# Patient Record
Sex: Female | Born: 1970 | Hispanic: No | Smoking: Never smoker
Health system: Southern US, Community
[De-identification: ages and names within clinical notes are randomized; demographics above are authoritative.]

## PROBLEM LIST (undated history)

## (undated) DIAGNOSIS — M549 Dorsalgia, unspecified: Secondary | ICD-10-CM

## (undated) DIAGNOSIS — G8929 Other chronic pain: Secondary | ICD-10-CM

## (undated) DIAGNOSIS — G43909 Migraine, unspecified, not intractable, without status migrainosus: Secondary | ICD-10-CM

## (undated) DIAGNOSIS — M5416 Radiculopathy, lumbar region: Secondary | ICD-10-CM

---

## 1997-10-08 ENCOUNTER — Ambulatory Visit (HOSPITAL_COMMUNITY): Admission: RE | Admit: 1997-10-08 | Discharge: 1997-10-08 | Payer: Self-pay | Admitting: Obstetrics

## 1997-12-08 ENCOUNTER — Inpatient Hospital Stay (HOSPITAL_COMMUNITY): Admission: AD | Admit: 1997-12-08 | Discharge: 1997-12-10 | Payer: Self-pay | Admitting: *Deleted

## 2002-06-17 ENCOUNTER — Emergency Department (HOSPITAL_COMMUNITY): Admission: EM | Admit: 2002-06-17 | Discharge: 2002-06-17 | Payer: Self-pay | Admitting: Emergency Medicine

## 2002-07-05 ENCOUNTER — Ambulatory Visit (HOSPITAL_COMMUNITY): Admission: RE | Admit: 2002-07-05 | Discharge: 2002-07-05 | Payer: Self-pay | Admitting: Internal Medicine

## 2002-09-02 ENCOUNTER — Encounter: Payer: Self-pay | Admitting: Family Medicine

## 2002-09-02 ENCOUNTER — Ambulatory Visit (HOSPITAL_COMMUNITY): Admission: RE | Admit: 2002-09-02 | Discharge: 2002-09-02 | Payer: Self-pay | Admitting: Family Medicine

## 2003-03-19 ENCOUNTER — Ambulatory Visit (HOSPITAL_COMMUNITY): Admission: RE | Admit: 2003-03-19 | Discharge: 2003-03-19 | Payer: Self-pay | Admitting: Internal Medicine

## 2005-11-30 ENCOUNTER — Ambulatory Visit: Payer: Self-pay | Admitting: Nurse Practitioner

## 2005-12-08 ENCOUNTER — Ambulatory Visit: Payer: Self-pay | Admitting: *Deleted

## 2005-12-19 ENCOUNTER — Encounter (INDEPENDENT_AMBULATORY_CARE_PROVIDER_SITE_OTHER): Payer: Self-pay | Admitting: *Deleted

## 2005-12-19 ENCOUNTER — Ambulatory Visit: Payer: Self-pay | Admitting: Nurse Practitioner

## 2006-09-20 ENCOUNTER — Encounter (INDEPENDENT_AMBULATORY_CARE_PROVIDER_SITE_OTHER): Payer: Self-pay | Admitting: *Deleted

## 2006-11-06 ENCOUNTER — Encounter: Admission: RE | Admit: 2006-11-06 | Discharge: 2006-11-06 | Payer: Self-pay | Admitting: Orthopedic Surgery

## 2008-03-10 IMAGING — CT CT L SPINE W/ CM
4 of 10 series · 11 of 33 positions shown, 13 images · IV contrast (omnipaque)
Comparison: none

CLINICAL DATA: Low back pain extends into the left leg.  Patient is referred for a lumbar myelogram.  
 LUMBAR MYELOGRAM:
TECHNIQUE: Lumbar puncture was performed centrally at the L-4 level.  CSF is clear.  15 cc Omnipaque 180 contrast injected.  The needle was withdrawn and multiple images are obtained which are supplemented with standing flexion and extension views.
TECHNIQUE: Multidetector CT imaging of the lumbar spine was performed after intrathecal injection of contrast.  Helical imaging supplemented with coronal and sagittal reformat images.

[Series 2: l spine · axial · 0.27mm/px · z∈[-175,-100]mm · 2 of 91 slices shown]
[im 31/91  bone]
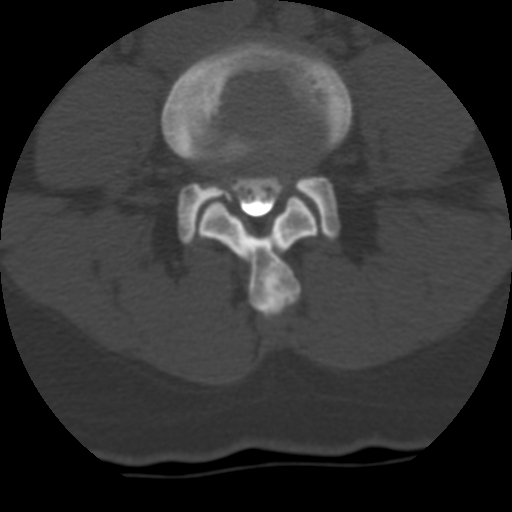
[im 61/91  bone]
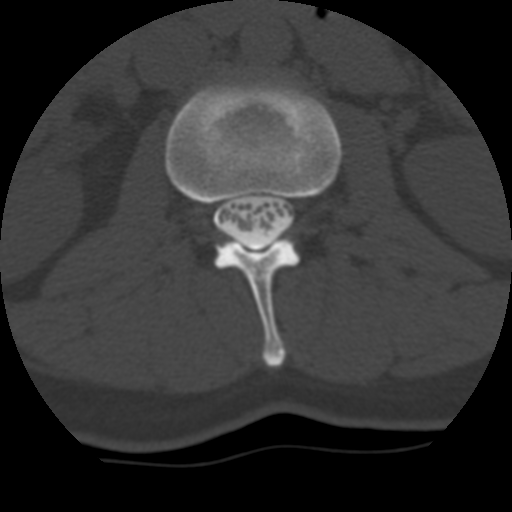

[Series 3: bone windows · axial · 0.27mm/px · z∈[-195,-83]mm · 3 of 91 slices shown, 4 images]
[im 23/91  soft-tissue]
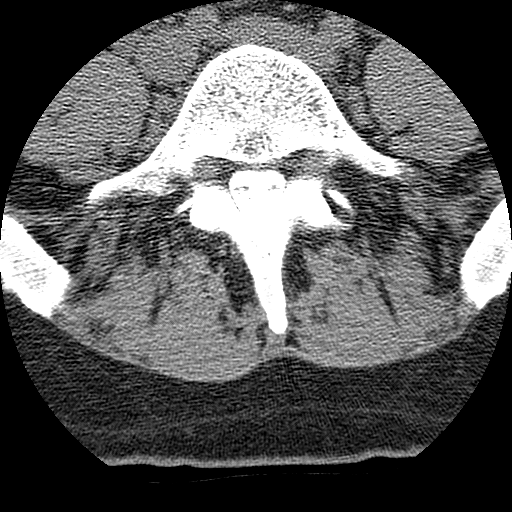
[im 23/91  bone]
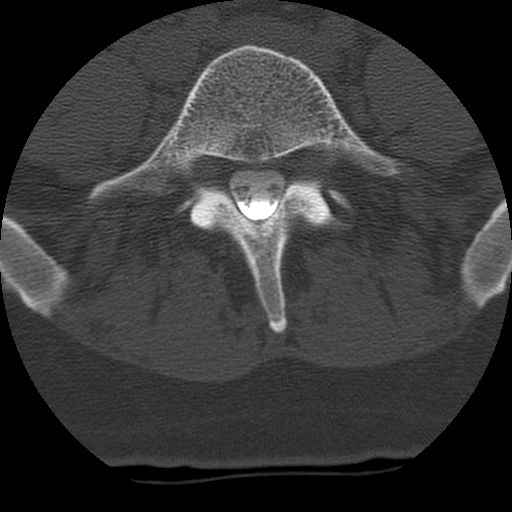
[im 46/91  bone]
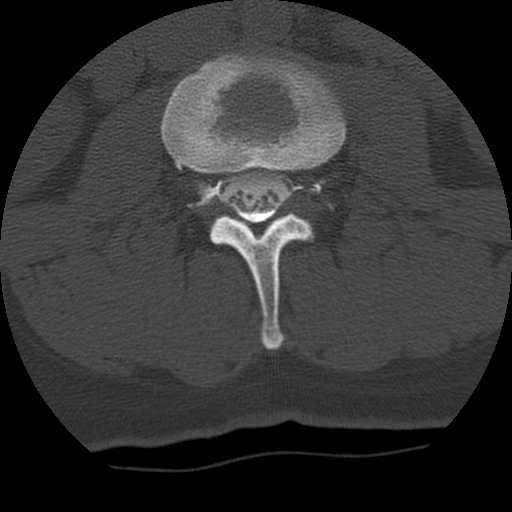
[im 68/91  bone]
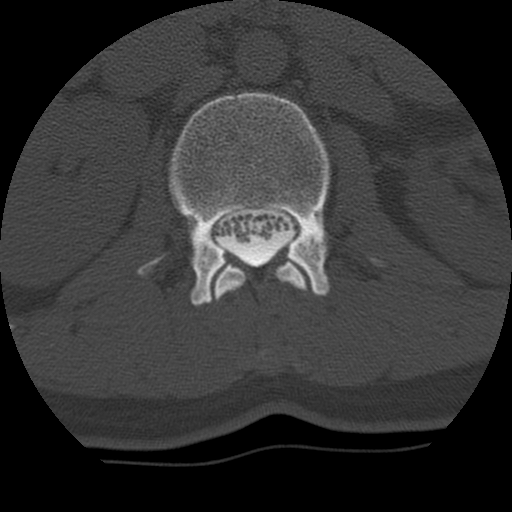

[Series 400: cor upper l-spne · coronal · 0.44mm/px · 1 of 40 slices shown]
[im 20/40  bone]
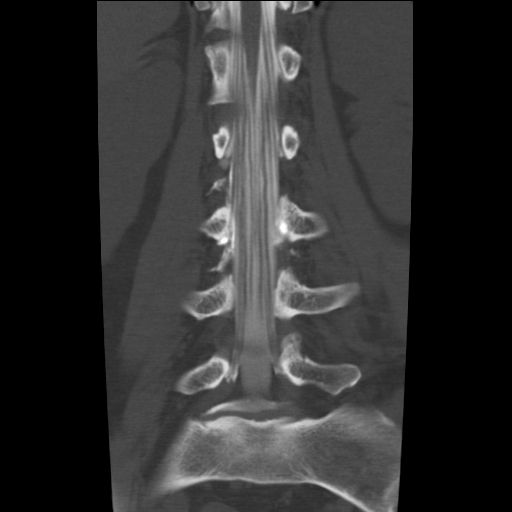

[Series 402: sag l-spne · sagittal · 0.44mm/px · 5 of 40 slices shown, 6 images]
[im 14/40  bone]
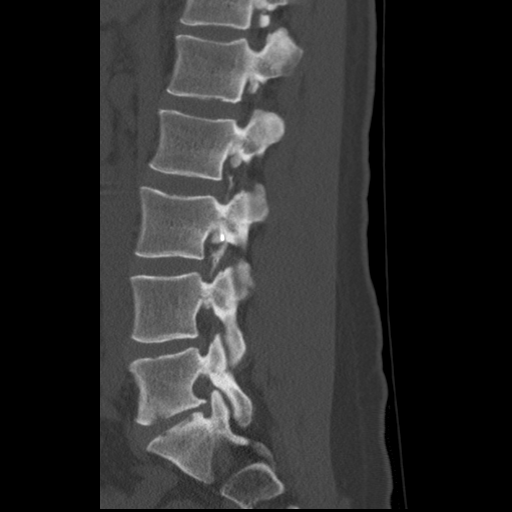
[im 17/40  bone]
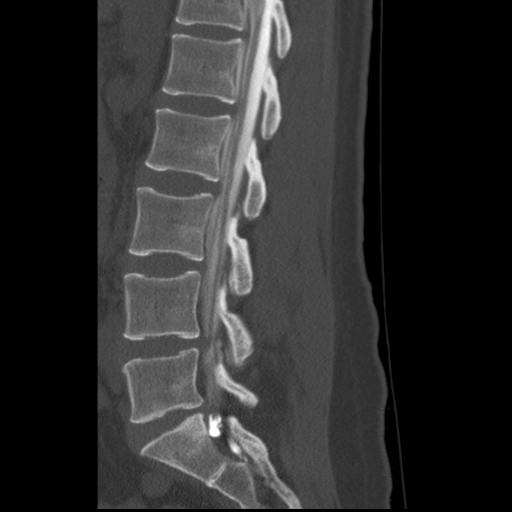
[im 20/40  soft-tissue]
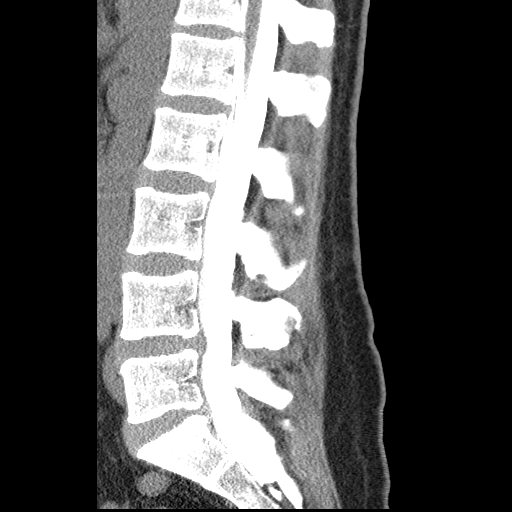
[im 20/40  bone]
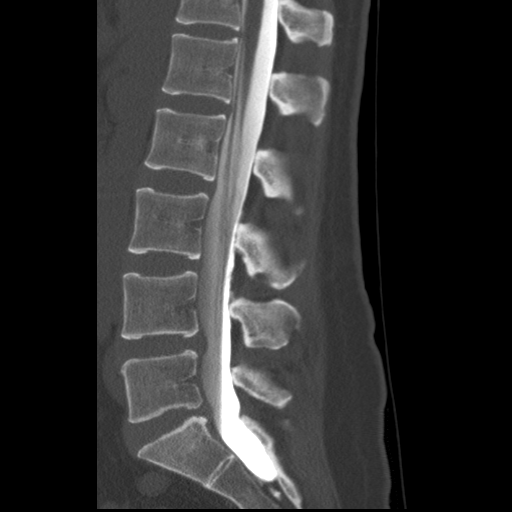
[im 23/40  bone]
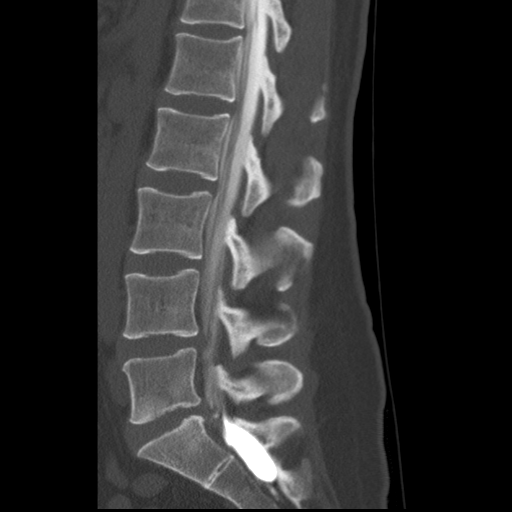
[im 27/40  bone]
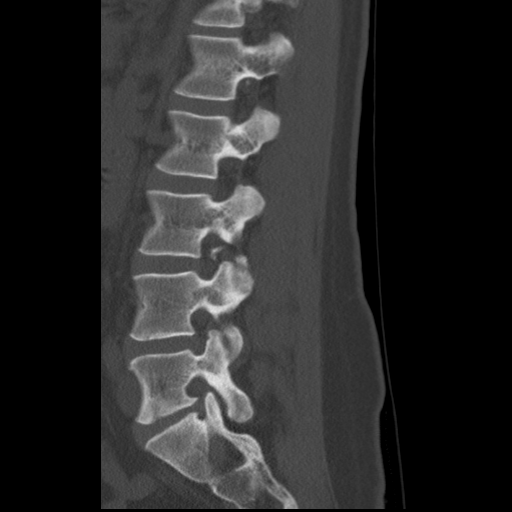

[11 of 33 positions shown; findings below may reference images not displayed]

FINDINGS: Ventral defect at L4-5 with moderate central stenosis.  No appreciable listhesis through flexion and extension.  There is disc height loss at L5-S1.  No extradural defects.
IMPRESSION: Degenerative disc disease L4-5 and L5-S1.  CT to follow.
 CT LUMBAR SPINE WITH CONTRAST (POST-MYELOGRAM):
FINDINGS: Reformat images show degenerative disc disease L5-S1.  Disc protrusion.  Foraminal narrowing bilaterally is multifactorial without encroachment on the exiting nerve roots.  At L4-5 a broad central protrusion is noted.  Conus terminates at L-1 which is unremarkable. 
 Axial Imaging:  
 L1-2:  Normal.  
 L2-3:  Normal. 
 L3-4:  Mild facet degenerative change.  No central nor neural foraminal stenosis. 
 L4-5:  Broad central protrusion with slight caudal extension.  Facet degenerative change bilaterally.  The L-4 roots exit without encroachment.  No significant lateral recess stenosis.  Mild multifactorial central stenosis. 
 L5-S1:  Disc bulge with caudal extension.  Facet degenerative change with uncinate spurring.  There is multifactorial narrowing of the left L-5 foramen without definite encroachment on the L-5 root.  There is underfilling of the left S-1 root without extruded disc material appreciated.
IMPRESSION: 1.  Degenerative disc disease with multifactorial left foraminal narrowing.  No definite encroachment on the L-5 root.  
 2.  L4-5:  Broad disc protrusion with facet degenerative change resulting in mild multifactorial central stenosis.

## 2008-03-31 ENCOUNTER — Ambulatory Visit: Payer: Self-pay | Admitting: Internal Medicine

## 2008-03-31 ENCOUNTER — Encounter (INDEPENDENT_AMBULATORY_CARE_PROVIDER_SITE_OTHER): Payer: Self-pay | Admitting: Internal Medicine

## 2008-03-31 LAB — CONVERTED CEMR LAB
Albumin: 4.1 g/dL (ref 3.5–5.2)
Alkaline Phosphatase: 53 units/L (ref 39–117)
BUN: 19 mg/dL (ref 6–23)
Calcium: 9 mg/dL (ref 8.4–10.5)
Chloride: 103 meq/L (ref 96–112)
Eosinophils Relative: 3 % (ref 0–5)
Glucose, Bld: 89 mg/dL (ref 70–99)
HCT: 37.9 % (ref 36.0–46.0)
Hemoglobin: 12.4 g/dL (ref 12.0–15.0)
Lymphocytes Relative: 24 % (ref 12–46)
Lymphs Abs: 1.8 10*3/uL (ref 0.7–4.0)
Monocytes Absolute: 0.7 10*3/uL (ref 0.1–1.0)
Monocytes Relative: 10 % (ref 3–12)
Neutro Abs: 4.7 10*3/uL (ref 1.7–7.7)
Potassium: 4.4 meq/L (ref 3.5–5.3)
RBC: 4.08 M/uL (ref 3.87–5.11)
Sodium: 138 meq/L (ref 135–145)
Total Protein: 7 g/dL (ref 6.0–8.3)
WBC: 7.4 10*3/uL (ref 4.0–10.5)

## 2013-12-10 ENCOUNTER — Encounter (HOSPITAL_COMMUNITY): Payer: Self-pay | Admitting: *Deleted

## 2013-12-11 ENCOUNTER — Encounter (HOSPITAL_COMMUNITY): Payer: Self-pay

## 2013-12-11 ENCOUNTER — Ambulatory Visit (HOSPITAL_COMMUNITY)
Admission: RE | Admit: 2013-12-11 | Discharge: 2013-12-11 | Disposition: A | Discharge status: Home / Self Care | Payer: Self-pay | Source: Ambulatory Visit | Attending: Obstetrics and Gynecology | Admitting: Obstetrics and Gynecology

## 2013-12-11 ENCOUNTER — Other Ambulatory Visit: Payer: Self-pay | Admitting: Obstetrics and Gynecology

## 2013-12-11 VITALS — BP 116/64 | Temp 98.7°F | Wt 144.0 lb

## 2013-12-11 DIAGNOSIS — R87810 Cervical high risk human papillomavirus (HPV) DNA test positive: Secondary | ICD-10-CM

## 2013-12-11 DIAGNOSIS — R87610 Atypical squamous cells of undetermined significance on cytologic smear of cervix (ASC-US): Secondary | ICD-10-CM

## 2013-12-11 DIAGNOSIS — Z1231 Encounter for screening mammogram for malignant neoplasm of breast: Secondary | ICD-10-CM

## 2013-12-11 DIAGNOSIS — Z1239 Encounter for other screening for malignant neoplasm of breast: Secondary | ICD-10-CM

## 2013-12-11 NOTE — Patient Instructions (Signed)
Explained to Marie Fleming the follow-up needed for her abnormal Pap smear. [facility] per recommendation to follow-up for abnormal Pap smear. Appointment scheduled for Monday, January 13, 2014 at 1400. Lowen Shon HaleLeon verbalized understanding. Patient escorted to mammography for a screening mammogram.  Roxana Lai, [name], RN 4:24 PM

## 2013-12-11 NOTE — Progress Notes (Signed)
[facility] smear at the Free cervical cancer screening on 11/04/2013.  Pap Smear: Pap smear not completed today. Last Pap smear was 11/04/2013 at the free Pap smear screening at [facility] and ASCUS HPV+.  [facility] for a colposcopy per recommendation to follow-up for abnormal Pap smear. Appointment scheduled for Monday, January 13, 2014 at 1400. Per patient her most recent Pap smear is the only abnormal Pap smear she has had. Last Pap smear is scanned into EPIC under media.  Physical exam: Breasts Breasts symmetrical. No skin abnormalities bilateral breasts. No nipple retraction bilateral breasts. No nipple discharge bilateral breasts. No lymphadenopathy. No lumps palpated bilateral breasts. No complaints of pain or tenderness on exam. Patient escorted to mammography for a screening mammogram.        Pelvic/Bimanual No Pap smear completed today since last Pap smear was 11/04/2013. Pap smear not indicated per BCCCP guidelines.

## 2014-01-13 ENCOUNTER — Other Ambulatory Visit (HOSPITAL_COMMUNITY)
Admission: RE | Admit: 2014-01-13 | Discharge: 2014-01-13 | Disposition: A | Discharge status: Home / Self Care | Payer: Self-pay | Source: Ambulatory Visit | Attending: Nurse Practitioner | Admitting: Nurse Practitioner

## 2014-01-13 ENCOUNTER — Ambulatory Visit (INDEPENDENT_AMBULATORY_CARE_PROVIDER_SITE_OTHER): Payer: Self-pay | Admitting: Nurse Practitioner

## 2014-01-13 ENCOUNTER — Encounter: Payer: Self-pay | Admitting: Nurse Practitioner

## 2014-01-13 VITALS — BP 107/62 | HR 66 | Temp 98.8°F | Ht 64.0 in | Wt 148.5 lb

## 2014-01-13 DIAGNOSIS — N942 Vaginismus: Secondary | ICD-10-CM

## 2014-01-13 DIAGNOSIS — R87610 Atypical squamous cells of undetermined significance on cytologic smear of cervix (ASC-US): Secondary | ICD-10-CM

## 2014-01-13 DIAGNOSIS — R87619 Unspecified abnormal cytological findings in specimens from cervix uteri: Secondary | ICD-10-CM | POA: Insufficient documentation

## 2014-01-13 LAB — POCT PREGNANCY, URINE: PREG TEST UR: NEGATIVE

## 2014-01-13 NOTE — Progress Notes (Signed)
    GYNECOLOGY CLINIC COLPOSCOPY PROCEDURE NOTE Marie GrillsDivina Fleming  44 y.o. G1P1001 here for colposcopy for ASCUS with POSITIVE high risk HPV pap smear on 11/04/13. Discussed role for HPV in cervical dysplasia, need for surveillance.  Patient given informed consent, signed copy in the chart, time out was performed.  Placed in lithotomy position. Cervix viewed with speculum and colposcope after application of acetic acid.   Colposcopy adequate? Yes  visible lesion(s) at 5, 10, 12  o'clock; biopsies obtained.  ECC specimen obtained. All specimens were labelled and sent to pathology.  Patient was given post procedure instructions.  Will follow up pathology and manage accordingly.  Routine preventative health maintenance measures emphasized.  Results for orders placed or performed in visit on 01/13/14 (from the past 24 hour(s))  Pregnancy, urine POC     Status: None   Collection Time: 01/13/14  2:02 PM  Result Value Ref Range   Preg Test, Ur NEGATIVE NEGATIVE     Jahshua Bonito, NP Faculty Practice, [facility]

## 2014-01-13 NOTE — Addendum Note (Signed)
Addended by: Louanna RawAMPBELL, Britlee Skolnik M on: 01/13/2014 04:35 PM   Modules accepted: Orders

## 2014-01-13 NOTE — Progress Notes (Signed)
Marie Fleming. [name] present as Research officer, trade unionpanish Interpreter for encounter

## 2014-01-13 NOTE — Patient Instructions (Signed)
Colposcopa, Cuidado posterior (Colposcopy, Care After) La colposcopa es un procedimiento en el que se utiliza una herramienta especial para magnificar la superficie del cuello del tero. Tambin es posible que se tome una muestra de tejido (biopsia). Esta muestra se observar para identificar la presencia de cncer cervical u otros problemas. Despus del procedimiento:  Podr sentir algunos clicos.  Recustese algunos minutos si se siente mareada.  Podr tener un sangrado que debera detenerse luego de algunos das. CUIDADOS EN EL HOGAR  No tenga relaciones sexuales ni use tampones durante 2 o 3 das o segn le hayan indicado.  Slo tome medicamentos como lo indique su mdico.  Contine tomando las pastillas anticonceptivas de la forma habitual. Averige los resultados de su anlisis Pregunte cundo estarn listos los resultados del examen. Asegrese de obtener los resultados. SOLICITE AYUDA DE INMEDIATO SI:  Tiene un sangrado abundante o elimina cogulos.  Su temperatura es de 102 F (38.9 C) o mayor.  Observa una secrecin vaginal anormal.  Tiene clicos que no se van con los medicamentos.  Siente mareos, vrtigo o pierde el conocimiento (se desmaya). ASEGRESE DE QUE:   Comprende estas instrucciones.  Controlar su enfermedad.  Solicitar ayuda de inmediato si no mejora o si empeora. Document Released: 01/22/2010 Document Revised: 03/14/2011 ExitCare Patient Information 2015 ExitCare, LLC. This information is not intended to replace advice given to you by your health care provider. Make sure you discuss any questions you have with your health care provider.  

## 2014-01-16 ENCOUNTER — Telehealth: Payer: Self-pay

## 2014-01-16 NOTE — Telephone Encounter (Signed)
Attempted to contact patient. No answer. Left message stating we are calling with results, please call clinic.  

## 2014-01-16 NOTE — Telephone Encounter (Signed)
-----   Message from Delbert PhenixLinda M Barefoot, NP sent at 01/15/2014  5:28 PM EST ----- Please repeat pap in one year, thank you, Bonita QuinLinda

## 2014-01-17 ENCOUNTER — Encounter: Payer: Self-pay | Admitting: *Deleted

## 2014-01-21 NOTE — Telephone Encounter (Signed)
Called patient with pacific interpreter [phone]#[id number]. Informed her of results and recommendations. Patient verbalized understanding and gratitude. No questions or concerns.

## 2014-02-01 ENCOUNTER — Encounter (HOSPITAL_COMMUNITY): Payer: Self-pay | Admitting: Emergency Medicine

## 2014-02-01 ENCOUNTER — Emergency Department (HOSPITAL_COMMUNITY)
Admission: EM | Admit: 2014-02-01 | Discharge: 2014-02-01 | Disposition: A | Discharge status: Home / Self Care | Payer: Self-pay | Attending: Emergency Medicine | Admitting: Emergency Medicine

## 2014-02-01 DIAGNOSIS — G43109 Migraine with aura, not intractable, without status migrainosus: Secondary | ICD-10-CM | POA: Insufficient documentation

## 2014-02-01 DIAGNOSIS — G8929 Other chronic pain: Secondary | ICD-10-CM | POA: Insufficient documentation

## 2014-02-01 HISTORY — DX: Dorsalgia, unspecified: M54.9

## 2014-02-01 HISTORY — DX: Other chronic pain: G89.29

## 2014-02-01 HISTORY — DX: Radiculopathy, lumbar region: M54.16

## 2014-02-01 MED ORDER — PROCHLORPERAZINE EDISYLATE 5 MG/ML IJ SOLN
10.0000 mg | Freq: Four times a day (QID) | INTRAMUSCULAR | Status: DC | PRN
  Administered 2014-02-01: 10 mg via INTRAVENOUS
  Filled 2014-02-01: qty 2

## 2014-02-01 MED ORDER — KETOROLAC TROMETHAMINE 30 MG/ML IJ SOLN
30.0000 mg | Freq: Once | INTRAMUSCULAR | Status: AC
  Administered 2014-02-01: 30 mg via INTRAVENOUS
  Filled 2014-02-01: qty 1

## 2014-02-01 MED ORDER — DIPHENHYDRAMINE HCL 50 MG/ML IJ SOLN
12.5000 mg | Freq: Once | INTRAMUSCULAR | Status: AC
  Administered 2014-02-01: 12.5 mg via INTRAVENOUS
  Filled 2014-02-01: qty 1

## 2014-02-01 MED ORDER — BUTALBITAL-APAP-CAFFEINE 50-325-40 MG PO TABS: 1.0000 | ORAL_TABLET | Freq: Four times a day (QID) | ORAL | Status: AC | PRN

## 2014-02-01 NOTE — ED Notes (Signed)
Sister-in-law/translator stated, she has a headache and nausea since 1130

## 2014-02-01 NOTE — ED Provider Notes (Signed)
CSN: [id number]     Arrival date & time 02/01/14  1451 History   First MD Initiated Contact with Patient 02/01/14 1725     Chief Complaint  Patient presents with  . Headache  . Nausea    Patient is a 44 y.o. female presenting with headaches. The history is provided by the patient.  Headache Pain location:  Frontal Quality:  Dull Radiates to:  Does not radiate Onset quality:  Gradual Duration:  7 hours (this morning) Timing:  Constant Chronicity:  Recurrent Relieved by:  Nothing Associated symptoms: blurred vision, nausea and vomiting   Associated symptoms: no fever, no numbness, no paresthesias, no sinus pressure and no weakness     Past Medical History  Diagnosis Date  . Chronic back pain   . Lumbar radiculopathy    History reviewed. No pertinent past surgical history. No family history on file. History  Substance Use Topics  . Smoking status: Never Smoker   . Smokeless tobacco: Never Used  . Alcohol Use: No   OB History    Gravida Para Term Preterm AB TAB SAB Ectopic Multiple Living   1 1 1       1      Review of Systems  Constitutional: Negative for fever.  HENT: Negative for sinus pressure.   Eyes: Positive for blurred vision.  Gastrointestinal: Positive for nausea and vomiting.  Neurological: Positive for headaches. Negative for numbness and paresthesias.  All other systems reviewed and are negative.     Allergies  Review of patient's allergies indicates no known allergies.  Home Medications   Prior to Admission medications   Medication Sig Start Date End Date Taking? Authorizing Provider  ibuprofen (ADVIL,MOTRIN) 200 MG tablet Take 200 mg by mouth every 6 (six) hours as needed.    Historical Provider, MD   BP 98/59 mmHg  Pulse 71  Temp(Src) 97.5 F (36.4 C) (Oral)  Resp 17  Ht 5\' 5"  (1.651 m)  Wt 148 lb (67.132 kg)  BMI 24.63 kg/m2  SpO2 100%  LMP 01/11/2014 Physical Exam  Constitutional: She is oriented to person, place, and time. She  appears well-developed and well-nourished. No distress.  HENT:  Head: Normocephalic and atraumatic.  Right Ear: External ear normal.  Left Ear: External ear normal.  Mouth/Throat: Oropharynx is clear and moist.  Eyes: Conjunctivae are normal. Right eye exhibits no discharge. Left eye exhibits no discharge. No scleral icterus.  Neck: Normal range of motion. Neck supple. No muscular tenderness present. No rigidity. No tracheal deviation and normal range of motion present.  Cardiovascular: Normal rate, regular rhythm and intact distal pulses.   Pulmonary/Chest: Effort normal and breath sounds normal. No stridor. No respiratory distress. She has no wheezes. She has no rales.  Abdominal: Soft. Bowel sounds are normal. She exhibits no distension. There is no tenderness. There is no rebound and no guarding.  Musculoskeletal: She exhibits no edema or tenderness.  Neurological: She is alert and oriented to person, place, and time. She has normal strength. No cranial nerve deficit (No facial droop, extraocular movements intact, tongue midline ) or sensory deficit. She exhibits normal muscle tone. She displays no seizure activity. Coordination normal.  No pronator drift bilateral upper extrem, able to hold both legs off bed for 5 seconds, sensation intact in all extremities, no visual field cuts, no left or right sided neglect, normal finger-nose exam bilaterally, no nystagmus noted   Skin: Skin is warm and dry. No rash noted.  Psychiatric: She has a normal  mood and affect.  Nursing note and vitals reviewed.   ED Course  Procedures (including critical care time) Medications  prochlorperazine (COMPAZINE) injection 10 mg (10 mg Intravenous Given 02/01/14 1805)  diphenhydrAMINE (BENADRYL) injection 12.5 mg (12.5 mg Intravenous Given 02/01/14 1805)  ketorolac (TORADOL) 30 MG/ML injection 30 mg (30 mg Intravenous Given 02/01/14 1805)     MDM   Final diagnoses:  Migraine with aura and without status  migrainosus, not intractable    The patient has a history of having recurrent headache episodes. Her symptoms are suggestive of a migraine headache. She was treated in the emergency room and had complete relief of her headache. She has a normal neurologic exam. I doubt meningitis, mass, intracerebral hemorrhage.  At this time there does not appear to be any evidence of an acute emergency medical condition and the patient appears stable for discharge with appropriate outpatient follow up.     Linwood Dibbles, MD 02/01/14 [phone]

## 2014-02-01 NOTE — Discharge Instructions (Signed)
Cefalea migrañosa °(Migraine Headache) °Una cefalea migrañosa es un dolor intenso y punzante en uno o ambos lados de la cabeza. La migraña puede durar desde 30 minutos hasta varias horas. °CAUSAS  °No siempre se conoce la causa exacta de la cefalea migrañosa. Sin embargo, pueden aparecer cuando los nervios del cerebro se irritan y liberan ciertas sustancias químicas que causan inflamación. Esto ocasiona dolor. °Existen también ciertos factores que pueden desencadenar las migrañas, como los siguientes: °· Alcohol. °· Fumar. °· Estrés. °· La menstruación °· Quesos añejados. °· Los alimentos o las bebidas que contienen nitratos, glutamato, aspartamo o tiramina. °· Falta de sueño. °· Chocolate. °· Cafeína. °· Hambre. °· Actividad física extenuante. °· Fatiga. °· Medicamentos que se usan para tratar el dolor en el pecho (nitroglicerina), píldoras anticonceptivas, estrógeno y algunos medicamentos para la hipertensión arterial. °SIGNOS Y SÍNTOMAS °· Dolor en uno o ambos lados de la cabeza. °· Dolor pulsante o punzante. °· Dolor intenso que impide realizar las actividades diarias. °· Dolor que se agrava por cualquier actividad física. °· Náuseas, vómitos o ambos. °· Mareos. °· Dolor con la exposición a las luces brillantes, a los ruidos fuertes o la actividad. °· Sensibilidad general a las luces brillantes, a los ruidos fuertes o a los olores. °Antes de sufrir una migraña, puede recibir señales de advertencia (aura). Un aura puede incluir: °· Ver las luces intermitentes. °· Ver puntos brillantes, halos o líneas en zigzag. °· Tener una visión en túnel o visión borrosa. °· Sensación de entumecimiento u hormigueo. °· Dificultad para hablar °· Debilidad muscular. °DIAGNÓSTICO  °La cefalea migrañosa se diagnostica en función de lo siguiente: °· Síntomas. °· Examen físico. °· Una TC (tomografía computada) o resonancia magnética de la cabeza. Estas pruebas de diagnóstico por imagen no pueden diagnosticar las migrañas, pero pueden  ayudar a descartar otras causas de las cefaleas. °TRATAMIENTO °Le prescribirán medicamentos para aliviar el dolor y las náuseas. También podrán administrarse medicamentos para ayudar a prevenir las migrañas recurrentes.  °INSTRUCCIONES PARA EL CUIDADO EN EL HOGAR °· Sólo tome medicamentos de venta libre o recetados para calmar el dolor o el malestar, según las indicaciones de su médico. No se recomienda usar los opiáceos a largo plazo. °· Cuando tenga la migraña, acuéstese en un cuarto oscuro y tranquilo °· Lleve un registro diario para averiguar lo que puede provocar las cefaleas migrañosas. Por ejemplo, escriba: °¨ Lo que usted come y bebe. °¨ Cuánto tiempo duerme. °¨ Algún cambio en su dieta o en los medicamentos. °· Limite el consumo de bebidas alcohólicas. °· Si fuma, deje de hacerlo. °· Duerma entre 7 y 9 horas, o según las recomendaciones del médico. °· Limite el estrés. °· Mantenga las luces tenues si le molestan las luces brillantes y la migraña empeora. °SOLICITE ATENCIÓN MÉDICA DE INMEDIATO SI:  °· La migraña se hace cada vez más intensa. °· Tiene fiebre. °· Presenta rigidez en el cuello. °· Tiene pérdida de visión. °· Presenta debilidad muscular o pérdida del control muscular. °· Comienza a perder el equilibrio o tiene problemas para caminar. °· Sufre mareos o se desmaya. °· Tiene síntomas graves que son diferentes a los primeros síntomas. °ASEGÚRESE DE QUE:  °· Comprende estas instrucciones. °· Controlará su afección. °· Recibirá ayuda de inmediato si no mejora o si empeora. °Document Released: 12/20/2004 Document Revised: 10/10/2012 °ExitCare® Patient Information ©2015 ExitCare, LLC. This information is not intended to replace advice given to you by your health care provider. Make sure you discuss any questions you have with your   health care provider. ° ° °

## 2017-03-21 ENCOUNTER — Ambulatory Visit (HOSPITAL_COMMUNITY)
Admission: RE | Admit: 2017-03-21 | Discharge: 2017-03-21 | Disposition: A | Discharge status: Home / Self Care | Payer: Self-pay | Source: Ambulatory Visit | Attending: Obstetrics and Gynecology | Admitting: Obstetrics and Gynecology

## 2017-03-21 ENCOUNTER — Encounter (HOSPITAL_COMMUNITY): Payer: Self-pay

## 2017-03-21 VITALS — BP 108/70 | Ht 64.0 in | Wt 154.0 lb

## 2017-03-21 DIAGNOSIS — Z1239 Encounter for other screening for malignant neoplasm of breast: Secondary | ICD-10-CM

## 2017-03-21 DIAGNOSIS — R87613 High grade squamous intraepithelial lesion on cytologic smear of cervix (HGSIL): Secondary | ICD-10-CM

## 2017-03-21 HISTORY — DX: Migraine, unspecified, not intractable, without status migrainosus: G43.909

## 2017-03-21 NOTE — Progress Notes (Signed)
[facility] the Barry Brun[facility]ia Lonergan at [facility] due to having an abnormal Pap smear on 01/18/2017 that a colposcopy is recommended for follow-up.   Pap Smear: Pap smear not completed today. Last [facility]2017 [facility] and [facility]. [facility] a colposcopy to follow-up for her abnormal Pap smear. Appointment scheduled for Monday, April 24, 2017 at 1800. Per patient has a history of one other abnormal Pap smear 11/04/2013 that was ASCUS with positive HPV. Patient had a colposcopy to follow-up for her previous abnormal Pap smear on 01/13/2014 that was benign. Last two Pap smears and colposcopy results are in Epic.  Physical exam: Breasts Breasts symmetrical. No skin abnormalities bilateral breasts. No nipple retraction bilateral breasts. No nipple discharge bilateral breasts. No lymphadenopathy. No lumps palpated bilateral breasts. No complaints of pain or tenderness on exam. Patient had a screening mamm[facility] two months.        Pelvic/Bimanual No Pap smear completed today since last Pap smear was 01/18/2017. Pap smear not indicated per BCCCP guidelines.   Smoking History: Patient has never smoked.  Patient Navigation: Patient education provided. Access to services provided for [facility]. Spanish interpreter provided.   Breast and Cervical Cancer Risk Assessment: Patient has no family history of breast cancer, known genetic mutations, or radiation treatment to the chest before age 46. Patient has no history of cervical dysplasia, immunocompromised, or DES exposure in-utero.  Used Spanish interpreter [employer]rika McReynolds from [employer].

## 2017-03-21 NOTE — Patient Instructions (Signed)
Explained breast self awareness with [name]. Patient did not need a Pap smear today due to last Pap smear was 01/18/2017. Explained the colposcopy the recommended follow-up for her abnormal Pap smear. [facility] for a colposcopy to follow-up for her abnormal Pap smear. Appointment scheduled for Monday, April 24, 2017 at 1800. Patient aware of appointment and will be there. Marie Fleming verbalized understanding.  [name], [name], RN 4:23 PM

## 2017-03-27 ENCOUNTER — Encounter (HOSPITAL_COMMUNITY): Payer: Self-pay | Admitting: *Deleted

## 2017-04-24 ENCOUNTER — Telehealth: Payer: Self-pay | Admitting: General Practice

## 2017-04-24 ENCOUNTER — Ambulatory Visit: Payer: Self-pay | Admitting: Obstetrics and Gynecology

## 2017-04-24 NOTE — Telephone Encounter (Signed)
Called patient to reschedule appt for 05/22/17 at 10:35am.  Patient can only be seen on Monday's.  Patient voiced understanding.

## 2017-05-22 ENCOUNTER — Ambulatory Visit: Payer: Self-pay | Admitting: Obstetrics & Gynecology

## 2017-06-29 ENCOUNTER — Other Ambulatory Visit (HOSPITAL_COMMUNITY)
Admission: RE | Admit: 2017-06-29 | Discharge: 2017-06-29 | Disposition: A | Discharge status: Home / Self Care | Payer: Self-pay | Source: Ambulatory Visit | Attending: Obstetrics & Gynecology | Admitting: Obstetrics & Gynecology

## 2017-06-29 ENCOUNTER — Ambulatory Visit (INDEPENDENT_AMBULATORY_CARE_PROVIDER_SITE_OTHER): Payer: Self-pay | Admitting: Obstetrics & Gynecology

## 2017-06-29 ENCOUNTER — Encounter: Payer: Self-pay | Admitting: Obstetrics & Gynecology

## 2017-06-29 VITALS — BP 111/66 | HR 69 | Wt 155.1 lb

## 2017-06-29 DIAGNOSIS — R87613 High grade squamous intraepithelial lesion on cytologic smear of cervix (HGSIL): Secondary | ICD-10-CM | POA: Insufficient documentation

## 2017-06-29 LAB — POCT PREGNANCY, URINE: Preg Test, Ur: NEGATIVE

## 2017-06-29 NOTE — Progress Notes (Signed)
   Subjective:    Patient ID: Marie Fleming, female    DOB: [birth year], 47 y.o.   MRN: [id number]  HPI  47 yo single 25P1 38(46 yo son) Latina here for colpo due to [facility]. She reports that she had a colpo last year but no treatment.  Review of Systems She is abstinent.    Objective:   Physical Exam Breathing, conversing, and ambulating normally UPT negative, consent signed, time out done Cervix prepped with acetic acid. Transformation zone seen in its entirety. Colpo adequate. Aceto white changes on the anterior lip, a small (about 2mm area) of punctation at the 11 o'clock position I removed this area ECC obtained. She tolerated the procedure well.      Assessment & Plan:  HGSIL pap- await pathology Come back 4 weeks for results

## 2017-07-14 ENCOUNTER — Encounter: Payer: Self-pay | Admitting: *Deleted

## 2017-07-27 ENCOUNTER — Encounter: Payer: Self-pay | Admitting: Obstetrics & Gynecology

## 2017-07-27 ENCOUNTER — Ambulatory Visit (INDEPENDENT_AMBULATORY_CARE_PROVIDER_SITE_OTHER): Payer: Self-pay | Admitting: Obstetrics & Gynecology

## 2017-07-27 VITALS — BP 122/54 | HR 63 | Wt 156.4 lb

## 2017-07-27 DIAGNOSIS — D061 Carcinoma in situ of exocervix: Secondary | ICD-10-CM

## 2017-07-27 NOTE — Progress Notes (Signed)
Spanish Interpreter # Blanca Lindher 

## 2017-07-27 NOTE — Progress Notes (Signed)
   Subjective:    Patient ID: Marie Fleming, female    DOB: 1970-11-06, 47 y.o.   MRN: [id number]  HPI  47 yo single 45P1 34(46 yo son) here today for discussion of her pathology results. She had a [facility]CCCP pap that showed HGSIL. Her colpo was consistent with this, CIN3 was seen on her pathology with a negative ECC.   Review of Systems She has been abstinent since 2015.     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  She will watch the LEEP video today.

## 2017-09-01 ENCOUNTER — Ambulatory Visit: Payer: Self-pay | Admitting: Obstetrics & Gynecology

## 2017-09-06 ENCOUNTER — Telehealth: Payer: Self-pay | Admitting: Obstetrics & Gynecology

## 2017-09-06 NOTE — Telephone Encounter (Signed)
Called patient about her rescheduled appointment. Was not able to get through. I will be mailing her a reminder appointment letter.

## 2017-10-06 ENCOUNTER — Encounter: Payer: Self-pay | Admitting: Obstetrics & Gynecology

## 2017-10-06 ENCOUNTER — Other Ambulatory Visit (HOSPITAL_COMMUNITY)
Admission: RE | Admit: 2017-10-06 | Discharge: 2017-10-06 | Disposition: A | Discharge status: Home / Self Care | Payer: Self-pay | Source: Ambulatory Visit | Attending: Obstetrics & Gynecology | Admitting: Obstetrics & Gynecology

## 2017-10-06 ENCOUNTER — Ambulatory Visit (INDEPENDENT_AMBULATORY_CARE_PROVIDER_SITE_OTHER): Payer: Self-pay | Admitting: Obstetrics & Gynecology

## 2017-10-06 VITALS — BP 107/67 | HR 70 | Wt 156.1 lb

## 2017-10-06 DIAGNOSIS — Z3202 Encounter for pregnancy test, result negative: Secondary | ICD-10-CM

## 2017-10-06 DIAGNOSIS — D061 Carcinoma in situ of exocervix: Secondary | ICD-10-CM

## 2017-10-06 LAB — POCT PREGNANCY, URINE: Preg Test, Ur: NEGATIVE

## 2017-10-06 NOTE — Addendum Note (Signed)
Addended by: Allie Bossier on: 10/06/2017 01:08 PM   Modules accepted: Orders

## 2017-10-06 NOTE — Progress Notes (Signed)
   Subjective:    Patient ID: Marie Fleming, female    DOB: Sep 07, 1970, 47 y.o.   MRN: [id number]  HPI   47 yo single P95 (6 yo son) here today for a LEEP. She had a BCCCP pap that showed HGSIL. Her colpo was consistent with this, CIN3 was seen on her pathology with a negative ECC.  She watched the video at her last visit.  Review of Systems She has been abstinent since 2015.    Objective:   Physical Exam Breathing, conversing, and ambulating normally Well nourished, well hydrated Latina, no apparent distress  Video interpretor used for this exam  Colpo Biopsy:   Risks, benefits, alternatives, and limitations of procedure explained to patient, including pain, bleeding, infection, failure to remove abnormal tissue and failure to cure dysplasia, need for repeat procedures, damage to pelvic organs, cervical incompetence.  Role of HPV,cervical dysplasia and need for close followup was empasized. Informed written consent was obtained. All questions were answered. Time out performed. Urine pregnancy test was negative.  Procedure: The patient was placed in lithotomy position and the bivalved coated speculum was placed in the patient's vagina. A grounding pad placed on the patient. Acetic acid was applied to the cervix and areas of decreased uptake were noted around the transformation zone.   Local anesthesia was administered via an intracervical block using 20cc of 2% Lidocaine with epinephrine. The suction was turned on and the large 1X radiusCone Biopsy Excisor on 50 Watts of cutting current was used to excise the entire transformation zone and any areas of visible dysplasia. I obtained an ECC.  Excellent hemostasis was achieved using roller ball coagulation set at 50 Watts coagulation current. The speculum was removed from the vagina. Specimens were sent to pathology.  The patient tolerated the procedure well. Post-operative instructions given to patient, including instruction to seek medical  attention for persistent bright red bleeding, fever, abdominal/pelvic pain, dysuria, nausea or vomiting. She was also told about the possibility of having copious yellow to black tinged discharge for weeks. She was counseled to avoid anything in the vagina (sex/douching/tampons) for 3 weeks. She has a 4 week post-operative check to assess wound healing, review results and discuss further management.      Assessment & Plan:  CIN 3- await pathology from LEEP Come back 4 weeks for post op visit

## 2018-01-15 ENCOUNTER — Ambulatory Visit (INDEPENDENT_AMBULATORY_CARE_PROVIDER_SITE_OTHER): Payer: Self-pay | Admitting: *Deleted

## 2018-01-15 DIAGNOSIS — R87613 High grade squamous intraepithelial lesion on cytologic smear of cervix (HGSIL): Secondary | ICD-10-CM

## 2018-01-15 NOTE — Progress Notes (Signed)
Marie Fleming came to window asking for results of LEEP from 10/2017.  I was asked to speak with her.  Per chart results released by MyChart by Dr.Dove. I informed her results per Dr.Dove's message and that she needs pap in one year. I advised her to call [facility] to get that renewed and get pap with them. She voices understanding. I used [employer] [phone]

## 2018-01-18 NOTE — Progress Notes (Signed)
I have reviewed this chart and agree with the RN/CMA assessment and management.    Betty Brooks C Harika Laidlaw, MD, FACOG Attending Physician, [facility]  

## 2018-08-08 ENCOUNTER — Other Ambulatory Visit (HOSPITAL_COMMUNITY): Payer: Self-pay | Admitting: *Deleted

## 2018-08-08 DIAGNOSIS — Z1231 Encounter for screening mammogram for malignant neoplasm of breast: Secondary | ICD-10-CM

## 2018-11-06 ENCOUNTER — Ambulatory Visit (HOSPITAL_COMMUNITY): Payer: Self-pay

## 2022-03-31 ENCOUNTER — Ambulatory Visit
Admission: RE | Admit: 2022-03-31 | Discharge: 2022-03-31 | Disposition: A | Discharge status: Home / Self Care | Payer: Self-pay | Source: Ambulatory Visit | Attending: Physician Assistant | Admitting: Physician Assistant

## 2022-03-31 VITALS — BP 136/83 | HR 67 | Temp 98.3°F | Resp 19

## 2022-03-31 DIAGNOSIS — N920 Excessive and frequent menstruation with regular cycle: Secondary | ICD-10-CM

## 2022-03-31 DIAGNOSIS — R5383 Other fatigue: Secondary | ICD-10-CM

## 2022-03-31 LAB — POCT URINALYSIS DIP (MANUAL ENTRY)
Bilirubin, UA: NEGATIVE
Glucose, UA: NEGATIVE mg/dL
Ketones, POC UA: NEGATIVE mg/dL
Leukocytes, UA: NEGATIVE
Nitrite, UA: NEGATIVE
Protein Ur, POC: NEGATIVE mg/dL
Spec Grav, UA: 1.015 (ref 1.010–1.025)
Urobilinogen, UA: 0.2 E.U./dL
pH, UA: 7 (ref 5.0–8.0)

## 2022-03-31 MED ORDER — PRENATAL COMPLETE 14-0.4 MG PO TABS: 1.0000 | ORAL_TABLET | Freq: Every day | ORAL | 2 refills | Status: AC

## 2022-03-31 NOTE — ED Provider Notes (Signed)
[facility]    CSN: [id number] Arrival date & time: 03/31/22  1216      History   Chief Complaint Chief Complaint  Patient presents with   Vaginal Bleeding    I have been having excessive vaginal bleeding for over a week now and this has happened many times before. - Entered by patient    HPI Marie Fleming is a 52 y.o. female.   52 year old female presents with abnormal vaginal bleeding.  History is being taken through daughter being the interpreter and the language is [place].  Patient indicates that she has been having abnormal vaginal bleeding for the past 18 months.  Patient indicates that she will have a normal period that would last for 4 days with heavy bleeding the first 2 days and then lightens up for the last 2 days.  She indicates that then about 2 weeks later she will have a period that usually last for 10 days that is heavy with clotting.  She usually will use 6 pads which becomes soaked.  She also indicates during this heavy period time she will have left lower quadrant pain and discomfort that is around the ovarian area that is sometimes associated with nausea but no vomiting.  She indicates this pain resolves when the bleeding stops.  Patient indicates that this process has been going on for the past 18 months but has recently become worse over the past couple months.  Patient indicates this last time she has been having vaginal bleeding for the past 12 days, with clots, using about 8 pads that are soaked.  Patient indicates that she is not having fever, chills, vomiting.  She is tolerating fluids well and eating.  She does indicates she has mild fatigue with the heavy bleeding episodes.  She does desire to have a multivitamin to take to make sure that she does not get too weak from the heavy bleeding.  She indicates she has not seen a gynecologist in the past 5 to 6 years.  She indicates there is no family history of pelvic pathology in the family.   Vaginal  Bleeding   Past Medical History:  Diagnosis Date   Chronic back pain    Lumbar radiculopathy    Migraine     Patient Active Problem List   Diagnosis Date Noted   Abnormal Pap smear of cervix 01/13/2014    History reviewed. No pertinent surgical history.  OB History     Gravida  1   Para  1   Term  1   Preterm      AB      Living  1      SAB      IAB      Ectopic      Multiple      Live Births               Home Medications    Prior to Admission medications   Medication Sig Start Date End Date Taking? Authorizing Provider  Prenatal Vit-Fe Fumarate-FA (PRENATAL COMPLETE) 14-0.4 MG TABS Take 1 tablet by mouth daily. 03/31/22  Yes Nyoka Lint, PA-C  ibuprofen (ADVIL,MOTRIN) 200 MG tablet Take 200 mg by mouth every 6 (six) hours as needed.    [provider]    Family History History reviewed. No pertinent family history.  Social History Social History   Tobacco Use   Smoking status: Never   Smokeless tobacco: Never  Vaping Use   Vaping Use: Never  used  Substance Use Topics   Alcohol use: No   Drug use: No     Allergies   Patient has no known allergies.   Review of Systems Review of Systems  Genitourinary:  Positive for vaginal bleeding.     Physical Exam Triage Vital Signs ED Triage Vitals  Enc Vitals Group     BP 03/31/22 1239 136/83     Pulse Rate 03/31/22 1239 67     Resp 03/31/22 1239 19     Temp 03/31/22 1239 98.3 F (36.8 C)     Temp src --      SpO2 03/31/22 1239 98 %     Weight --      Height --      Head Circumference --      Peak Flow --      Pain Score 03/31/22 1235 4     Pain Loc --      Pain Edu? --      Excl. in [place]? --    No data found.  Updated Vital Signs BP 136/83   Pulse 67   Temp 98.3 F (36.8 C)   Resp 19   LMP 03/18/2022   SpO2 98%   Visual Acuity Right Eye Distance:   Left Eye Distance:   Bilateral Distance:    Right Eye Near:   Left Eye Near:    Bilateral Near:      Physical Exam Constitutional:      Appearance: Normal appearance.  Cardiovascular:     Rate and Rhythm: Normal rate and regular rhythm.     Heart sounds: Normal heart sounds.  Pulmonary:     Effort: Pulmonary effort is normal.     Breath sounds: Normal breath sounds and air entry. No wheezing, rhonchi or rales.  Abdominal:     General: Abdomen is flat. Bowel sounds are normal.     Palpations: Abdomen is soft.     Tenderness: There is no abdominal tenderness. There is no guarding or rebound.  Neurological:     Mental Status: She is alert.      UC Treatments / Results  Labs (all labs ordered are listed, but only abnormal results are displayed) Labs Reviewed  CBC  COMPREHENSIVE METABOLIC PANEL  TSH  POCT URINALYSIS DIP (MANUAL ENTRY)    EKG   Radiology No results found.  Procedures Procedures (including critical care time)  Medications Ordered in UC Medications - No data to display  Initial Impression / Assessment and Plan / UC Course  I have reviewed the triage vital signs and the nursing notes.  Pertinent labs & imaging results that were available during my care of the patient were reviewed by me and considered in my medical decision making (see chart for details).    Plan: The diagnosis of be treated with the following: 1.  Fatigue: A.  Prenatal complete multivitamins, 1 daily. 2.  Menorrhagia with regular cycle: A.  CBC, CMP, TSH lab results are pending. B.  Internal referral to [facility] women's health has been initiated for evaluation of the long standing abnormal vaginal bleeding. 3.  Advised follow-up PCP return to urgent care as needed. Final Clinical Impressions(s) / UC Diagnoses   Final diagnoses:  Menorrhagia with regular cycle  Other fatigue     Discharge Instructions      Advised to take the prenatal complete vitamins, 1 tablet daily.  Internal referral has been made to [facility] women's health gynecology, their office should be  calling within the next 48 hours to arrange an appointment to be seen.  Labs will be completed in 48 hours.  If you do not get a call from this office that indicates labs are negative.  Log onto MyChart to be the test results when they post in 48 hours.  Advised follow-up PCP return to urgent care as needed.    ED Prescriptions     Medication Sig Dispense Auth. Provider   Prenatal Vit-Fe Fumarate-FA (PRENATAL COMPLETE) 14-0.4 MG TABS Take 1 tablet by mouth daily. 30 tablet Nyoka Lint, PA-C      PDMP not reviewed this encounter.   Nyoka Lint, PA-C 03/31/22 1304

## 2022-03-31 NOTE — ED Triage Notes (Signed)
Pt presents to [facility] with family who is translating for her. Pt reports over the last year she has been having heavy menstrual bleeding with clots. This past 12 days she has been bleeding with clots and cramps. Pt reports she has to use 6-10 pads a day that are soaked through.

## 2022-03-31 NOTE — Discharge Instructions (Signed)
Advised to take the prenatal complete vitamins, 1 tablet daily.  Internal referral has been made to [facility] women's health gynecology, their office should be calling within the next 48 hours to arrange an appointment to be seen.  Labs will be completed in 48 hours.  If you do not get a call from this office that indicates labs are negative.  Log onto MyChart to be the test results when they post in 48 hours.  Advised follow-up PCP return to urgent care as needed.

## 2022-04-01 LAB — COMPREHENSIVE METABOLIC PANEL
ALT: 12 IU/L (ref 0–32)
AST: 19 IU/L (ref 0–40)
Albumin/Globulin Ratio: 1.5 (ref 1.2–2.2)
Albumin: 4.3 g/dL (ref 3.9–4.9)
Alkaline Phosphatase: 72 IU/L (ref 44–121)
BUN/Creatinine Ratio: 24 — ABNORMAL HIGH (ref 9–23)
BUN: 14 mg/dL (ref 6–24)
Bilirubin Total: 0.3 mg/dL (ref 0.0–1.2)
CO2: 19 mmol/L — ABNORMAL LOW (ref 20–29)
Calcium: 8.8 mg/dL (ref 8.7–10.2)
Chloride: 100 mmol/L (ref 96–106)
Creatinine, Ser: 0.58 mg/dL (ref 0.57–1.00)
Globulin, Total: 2.8 g/dL (ref 1.5–4.5)
Glucose: 78 mg/dL (ref 70–99)
Potassium: 4 mmol/L (ref 3.5–5.2)
Sodium: 138 mmol/L (ref 134–144)
Total Protein: 7.1 g/dL (ref 6.0–8.5)
eGFR: 110 mL/min/{1.73_m2} (ref >59)

## 2022-04-01 LAB — CBC
Hematocrit: 37 % (ref 34.0–46.6)
Hemoglobin: 12.4 g/dL (ref 11.1–15.9)
MCH: 31.4 pg (ref 26.6–33.0)
MCHC: 33.5 g/dL (ref 31.5–35.7)
MCV: 94 fL (ref 79–97)
Platelets: 395 10*3/uL (ref 150–450)
RBC: 3.95 x10E6/uL (ref 3.77–5.28)
RDW: 12.8 % (ref 11.7–15.4)
WBC: 5.6 10*3/uL (ref 3.4–10.8)

## 2022-04-01 LAB — TSH: TSH: 0.933 u[IU]/mL (ref 0.450–4.500)
